# Patient Record
Sex: Female | Born: 1980 | Race: Black or African American | Hispanic: No | Marital: Single | Smoking: Never smoker
Health system: Southern US, Community
[De-identification: ages and names within clinical notes are randomized; demographics above are authoritative.]

## PROBLEM LIST (undated history)

## (undated) ENCOUNTER — Emergency Department (HOSPITAL_COMMUNITY): Admission: EM | Payer: BC Managed Care – PPO

## (undated) DIAGNOSIS — J45909 Unspecified asthma, uncomplicated: Secondary | ICD-10-CM

## (undated) HISTORY — PX: WISDOM TOOTH EXTRACTION: SHX21

---

## 2004-05-28 ENCOUNTER — Other Ambulatory Visit: Admission: RE | Admit: 2004-05-28 | Discharge: 2004-05-28 | Payer: Self-pay | Admitting: Gynecology

## 2004-11-28 ENCOUNTER — Other Ambulatory Visit: Admission: RE | Admit: 2004-11-28 | Discharge: 2004-11-28 | Payer: Self-pay | Admitting: Gynecology

## 2005-03-02 ENCOUNTER — Emergency Department (HOSPITAL_COMMUNITY): Admission: EM | Admit: 2005-03-02 | Discharge: 2005-03-02 | Payer: Self-pay | Admitting: Emergency Medicine

## 2005-06-17 ENCOUNTER — Other Ambulatory Visit: Admission: RE | Admit: 2005-06-17 | Discharge: 2005-06-17 | Payer: Self-pay | Admitting: Gynecology

## 2006-06-20 ENCOUNTER — Other Ambulatory Visit: Admission: RE | Admit: 2006-06-20 | Discharge: 2006-06-20 | Payer: Self-pay | Admitting: Gynecology

## 2006-08-03 ENCOUNTER — Emergency Department (HOSPITAL_COMMUNITY): Admission: EM | Admit: 2006-08-03 | Discharge: 2006-08-04 | Payer: Self-pay | Admitting: Emergency Medicine

## 2009-05-28 ENCOUNTER — Inpatient Hospital Stay (HOSPITAL_COMMUNITY): Admission: AD | Admit: 2009-05-28 | Discharge: 2009-05-29 | Payer: Self-pay | Admitting: Obstetrics and Gynecology

## 2009-06-01 ENCOUNTER — Inpatient Hospital Stay (HOSPITAL_COMMUNITY): Admission: AD | Admit: 2009-06-01 | Discharge: 2009-06-03 | Payer: Self-pay | Admitting: Obstetrics and Gynecology

## 2010-09-10 LAB — CBC
HCT: 35.9 % — ABNORMAL LOW (ref 36.0–46.0)
Hemoglobin: 12 g/dL (ref 12.0–15.0)
Hemoglobin: 9.2 g/dL — ABNORMAL LOW (ref 12.0–15.0)
MCV: 95.3 fL (ref 78.0–100.0)
MCV: 97.2 fL (ref 78.0–100.0)
Platelets: 221 10*3/uL (ref 150–400)
Platelets: 273 10*3/uL (ref 150–400)
RDW: 13.4 % (ref 11.5–15.5)
RDW: 13.5 % (ref 11.5–15.5)
WBC: 8.8 10*3/uL (ref 4.0–10.5)

## 2010-09-10 LAB — RPR: RPR Ser Ql: NONREACTIVE

## 2012-05-26 ENCOUNTER — Emergency Department (HOSPITAL_COMMUNITY)
Admission: EM | Admit: 2012-05-26 | Discharge: 2012-05-26 | Disposition: A | Discharge status: Home / Self Care | Payer: BC Managed Care – PPO | Attending: Emergency Medicine | Admitting: Emergency Medicine

## 2012-05-26 ENCOUNTER — Encounter (HOSPITAL_COMMUNITY): Payer: Self-pay | Admitting: Emergency Medicine

## 2012-05-26 DIAGNOSIS — Y92410 Unspecified street and highway as the place of occurrence of the external cause: Secondary | ICD-10-CM | POA: Insufficient documentation

## 2012-05-26 DIAGNOSIS — S46909A Unspecified injury of unspecified muscle, fascia and tendon at shoulder and upper arm level, unspecified arm, initial encounter: Secondary | ICD-10-CM | POA: Insufficient documentation

## 2012-05-26 DIAGNOSIS — S4980XA Other specified injuries of shoulder and upper arm, unspecified arm, initial encounter: Secondary | ICD-10-CM | POA: Insufficient documentation

## 2012-05-26 DIAGNOSIS — Y9389 Activity, other specified: Secondary | ICD-10-CM | POA: Insufficient documentation

## 2012-05-26 MED ORDER — CYCLOBENZAPRINE HCL 10 MG PO TABS: 10.0000 mg | ORAL_TABLET | Freq: Three times a day (TID) | ORAL | Status: DC | PRN

## 2012-05-26 MED ORDER — NAPROXEN 500 MG PO TABS: 500.0000 mg | ORAL_TABLET | Freq: Two times a day (BID) | ORAL | Status: DC
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
## 2012-05-26 NOTE — ED Provider Notes (Signed)
Medical screening examination/treatment/procedure(s) were performed by non-physician practitioner and as supervising physician I was immediately available for consultation/collaboration.   Virjean Boman M Shantika Bermea, DO 05/26/12 1908 
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
## 2014-01-12 ENCOUNTER — Encounter (HOSPITAL_COMMUNITY): Payer: Self-pay | Admitting: Emergency Medicine

## 2014-01-12 ENCOUNTER — Emergency Department (HOSPITAL_COMMUNITY)
Admission: EM | Admit: 2014-01-12 | Discharge: 2014-01-12 | Disposition: A | Discharge status: Home / Self Care | Payer: BC Managed Care – PPO | Attending: Emergency Medicine | Admitting: Emergency Medicine

## 2014-01-12 DIAGNOSIS — Z880 Allergy status to penicillin: Secondary | ICD-10-CM | POA: Insufficient documentation

## 2014-01-12 DIAGNOSIS — Z79899 Other long term (current) drug therapy: Secondary | ICD-10-CM | POA: Insufficient documentation

## 2014-01-12 DIAGNOSIS — M25473 Effusion, unspecified ankle: Secondary | ICD-10-CM | POA: Insufficient documentation

## 2014-01-12 DIAGNOSIS — M25472 Effusion, left ankle: Secondary | ICD-10-CM

## 2014-01-12 DIAGNOSIS — M25476 Effusion, unspecified foot: Principal | ICD-10-CM | POA: Insufficient documentation

## 2014-01-12 DIAGNOSIS — M79609 Pain in unspecified limb: Secondary | ICD-10-CM | POA: Insufficient documentation

## 2014-01-12 NOTE — ED Notes (Signed)
Pt c/o left foot swelling x 1 week. Denies injury to foot. Nad, skin warm and dry, resp e/u.

## 2014-01-12 NOTE — Discharge Instructions (Signed)
1. Medications: usual home medications 2. Treatment: rest, drink plenty of fluids, elevate foot 3. Follow Up: Please followup with your primary doctor in 3 days after the Venous Duplex for discussion of your diagnoses and further evaluation after today's visit; if you do not have a primary care doctor use the resource guide provided to find one;   IMPORTANT PATIENT INSTRUCTIONS:  You have been scheduled for an Outpatient Vascular Study at [facility].    If tomorrow is a Saturday or Sunday, please go to the [facility] Emergency Department Registration Desk at 8:30 am tomorrow morning and tell them you are there for a vascular study.  If tomorrow is a weekday (Monday-Friday), please go to [facility] Admitting Department at 8:30 am and tell them you  are  there for a vascular study.   Edema Edema is an abnormal buildup of fluids in your bodytissues. Edema is somewhatdependent on gravity to pull the fluid to the lowest place in your body. That makes the condition more common in the legs and thighs (lower extremities). Painless swelling of the feet and ankles is common and becomes more likely as you get older. It is also common in looser tissues, like around your eyes.  When the affected area is squeezed, the fluid may move out of that spot and leave a dent for a few moments. This dent is called pitting.  CAUSES  There are many possible causes of edema. Eating too much salt and being on your feet or sitting for a long time can cause edema in your legs and ankles. Hot weather may make edema worse. Common medical causes of edema include:  Heart failure.  Liver disease.  Kidney disease.  Weak blood vessels in your legs.  Cancer.  An injury.  Pregnancy.  Some medications.  Obesity. SYMPTOMS  Edema is usually painless.Your skin may look swollen or shiny.  DIAGNOSIS  Your health care provider may be able to diagnose edema by asking about your medical history and doing a physical  exam. You may need to have tests such as X-rays, an electrocardiogram, or blood tests to check for medical conditions that may cause edema.  TREATMENT  Edema treatment depends on the cause. If you have heart, liver, or kidney disease, you need the treatment appropriate for these conditions. General treatment may include:  Elevation of the affected body part above the level of your heart.  Compression of the affected body part. Pressure from elastic bandages or support stockings squeezes the tissues and forces fluid back into the blood vessels. This keeps fluid from entering the tissues.  Restriction of fluid and salt intake.  Use of a water pill (diuretic). These medications are appropriate only for some types of edema. They pull fluid out of your body and make you urinate more often. This gets rid of fluid and reduces swelling, but diuretics can have side effects. Only use diuretics as directed by your health care provider. HOME CARE INSTRUCTIONS   Keep the affected body part above the level of your heart when you are lying down.   Do not sit still or stand for prolonged periods.   Do not put anything directly under your knees when lying down.  Do not wear constricting clothing or garters on your upper legs.   Exercise your legs to work the fluid back into your blood vessels. This may help the swelling go down.   Wear elastic bandages or support stockings to reduce ankle swelling as directed by  your health care provider.   Eat a low-salt diet to reduce fluid if your health care provider recommends it.   Only take medicines as directed by your health care provider. SEEK MEDICAL CARE IF:   Your edema is not responding to treatment.  You have heart, liver, or kidney disease and notice symptoms of edema.  You have edema in your legs that does not improve after elevating them.   You have sudden and unexplained weight gain. SEEK IMMEDIATE MEDICAL CARE IF:   You develop  shortness of breath or chest pain.   You cannot breathe when you lie down.  You develop pain, redness, or warmth in the swollen areas.   You have heart, liver, or kidney disease and suddenly get edema.  You have a fever and your symptoms suddenly get worse. MAKE SURE YOU:   Understand these instructions.  Will watch your condition.  Will get help right away if you are not doing well or get worse. Document Released: 05/27/2005 Document Revised: 10/11/2013 Document Reviewed: 03/19/2013 [facility] Patient Information 2015 [place], [place]. This information is not intended to replace advice given to you by your health care provider. Make sure you discuss any questions you have with your health care provider.

## 2014-01-12 NOTE — ED Provider Notes (Signed)
CSN: [id number]     Arrival date & time 01/12/14  1823 History   This chart was scribed for non-physician practitioner, Dierdre ForthHannah Deonna Krummel PA-C, working with Samuel JesterKathleen McManus, DO by Milly JakobJohn Lee Graves, ED Scribe. The patient was seen in room TR08C/TR08C. Patient's care was started at 8:04 PM.   Chief Complaint  Patient presents with  . Foot Pain   Patient is a 33 y.o. female presenting with lower extremity pain. The history is provided by the patient and medical records. No language interpreter was used.  Foot Pain This is a new (painless swelling) problem. The current episode started more than 1 week ago. The problem occurs constantly. The problem has not changed since onset.Pertinent negatives include no chest pain, no abdominal pain, no headaches and no shortness of breath. Nothing aggravates the symptoms. Nothing relieves the symptoms.   HPI Comments: Ashley StanfordMichelle L Booth is a 33 y.o. female who presents to the Emergency Department complaining of painless swelling in her left ankle and foot onset 1.5 weeks ago. She denies running, exercise or injury. She denies fever or chills. She denies recent travel, surgery, long periods sitting still, recent fractures, exogenous estrogen. She denies history of DVT. Patient denies history of fevers or chills, chest pain, shortness of breath or palpitations.  She denies known trauma, recent fractures, ecchymosis or redness of the foot/ankle.    History reviewed. No pertinent past medical history. History reviewed. No pertinent past surgical history. No family history on file. History  Substance Use Topics  . Smoking status: Never Smoker   . Smokeless tobacco: Not on file  . Alcohol Use: Yes   OB History   Grav Para Term Preterm Abortions TAB SAB Ect Mult Living                 Review of Systems  Constitutional: Negative for fever, chills, diaphoresis, appetite change, fatigue and unexpected weight change.  HENT: Negative for mouth sores.   Eyes: Negative  for visual disturbance.  Respiratory: Negative for cough, chest tightness, shortness of breath and wheezing.   Cardiovascular: Negative for chest pain.  Gastrointestinal: Negative for nausea, vomiting, abdominal pain, diarrhea and constipation.  Endocrine: Negative for polydipsia, polyphagia and polyuria.  Genitourinary: Negative for dysuria, urgency, frequency and hematuria.  Musculoskeletal: Positive for joint swelling (left foot). Negative for back pain and neck stiffness.  Skin: Negative for rash.  Allergic/Immunologic: Negative for immunocompromised state.  Neurological: Negative for syncope, light-headedness and headaches.  Hematological: Does not bruise/bleed easily.  Psychiatric/Behavioral: Negative for sleep disturbance. The patient is not nervous/anxious.     Allergies  Penicillins and Sulfa antibiotics  Home Medications   Prior to Admission medications   Medication Sig Start Date End Date Taking? Authorizing Provider  Biotin 5000 MCG TABS Take 1 tablet by mouth daily.   Yes Historical Provider, MD   Triage Vitals: BP 159/66  Pulse 87  Temp(Src) 98.8 F (37.1 C)  Resp 18  SpO2 100%  LMP 12/30/2013 Physical Exam  Nursing note and vitals reviewed. Constitutional: She appears well-developed and well-nourished. No distress.  Awake, alert, nontoxic appearance  HENT:  Head: Normocephalic and atraumatic.  Mouth/Throat: Oropharynx is clear and moist. No oropharyngeal exudate.  Eyes: Conjunctivae are normal. No scleral icterus.  Neck: Normal range of motion. Neck supple.  Cardiovascular: Normal rate, regular rhythm, normal heart sounds and intact distal pulses.   No murmur heard. Capillary refill < 3 sec Regular rate and rhythm  Pulmonary/Chest: Effort normal and breath sounds normal. No  respiratory distress. She has no wheezes.  Equal chest expansion Clear and equal breath sounds without rales  Abdominal: Soft. Bowel sounds are normal. She exhibits no mass. There is no  tenderness. There is no rebound and no guarding.  Musculoskeletal: Normal range of motion. She exhibits edema. She exhibits no tenderness.  ROM: Full range of motion of all joints in the left lower extremity without pain Swelling of the mid forefoot to just above the ankle with pitting edema noted to the medial and lateral aspect of the ankle No pain to palpation of the left calf Negative Homans sign No palpable cord  Neurological: She is alert. Coordination normal.  Sensation intact to dull and sharp Strength 5/5 including dorsiflexion and plantar flexion and The patient ambulates without pain or difficulty  Skin: Skin is warm and dry. She is not diaphoretic. No erythema.  No tenting of the skin No erythema or induration over the ankle No increased warmth  Psychiatric: She has a normal mood and affect.    ED Course  Procedures (including critical care time) DIAGNOSTIC STUDIES: Oxygen Saturation is 100% on room air, normal by my interpretation.    COORDINATION OF CARE: 8:11 PM-Discussed treatment plan which includes follow up here for a left foot ultrasound with pt at bedside and pt agreed to plan.   Labs Review Labs Reviewed - No data to display  Imaging Review No results found.   EKG Interpretation None      MDM   Final diagnoses:  Left ankle swelling   Ashley Booth presents with several weeks of left ankle swelling. Patient is PERC negative and Wells DVT score of 1.  Patient is low risk but without known trauma, twisting of the ankle or other likely diagnosis but believes that we need to rule out DVT of the left leg. Swelling is without erythema or induration to suggest cellulitis. No pain with range of motion to the ankle to suggest a septic joint or gout. Patient is without systemic signs of infection.  Discussed with patient the need for outpatient venous duplex study.  Also discussed usual protocol of Lovenox administration tonight with study in the morning.   Patient reports she will report for the study in the morning but declines the Lovenox shot at this time.  She is low risk for DVT. She is not tachycardic and there is no evidence of PE.  Patient with unilateral leg swelling only. Patient without heart murmur, clear equal breath sounds and no rails to suggest congestive heart failure.  Patient denies chest pain or shortness of breath.  Doubt PE, CHF, gout, septic joint, cellulitis.    I have personally reviewed patient's vitals, nursing note and any pertinent labs or imaging.  I performed an undressed physical exam.    At this time, it has been determined that no acute conditions requiring further emergency intervention. The patient/guardian have been advised of the diagnosis and plan. I reviewed all labs and imaging including any potential incidental findings. We have discussed signs and symptoms that warrant return to the ED, such as short of breath, palpitations, increasing leg swelling, fevers.  Patient/guardian has voiced understanding and agreed to follow-up with the PCP or specialist in 3 days after venous duplex in the morning.  Vital signs are stable at discharge.   BP 134/79  Pulse 80  Temp(Src) 98.9 F (37.2 C) (Oral)  Resp 16  SpO2 100%  LMP 12/30/2013        Dierdre Forth, PA-C 01/12/14  2046 

## 2014-01-13 ENCOUNTER — Ambulatory Visit (HOSPITAL_COMMUNITY)
Admission: RE | Admit: 2014-01-13 | Discharge: 2014-01-13 | Disposition: A | Discharge status: Home / Self Care | Payer: BC Managed Care – PPO | Source: Ambulatory Visit | Attending: Emergency Medicine | Admitting: Emergency Medicine

## 2014-01-13 DIAGNOSIS — M7989 Other specified soft tissue disorders: Secondary | ICD-10-CM | POA: Insufficient documentation

## 2014-01-13 NOTE — ED Provider Notes (Signed)
Medical screening examination/treatment/procedure(s) were performed by non-physician practitioner and as supervising physician I was immediately available for consultation/collaboration.   EKG Interpretation None        Samuel JesterKathleen Amry Cathy, DO 01/13/14 2354

## 2014-01-13 NOTE — Progress Notes (Signed)
VASCULAR LAB PRELIMINARY  PRELIMINARY  PRELIMINARY  PRELIMINARY  Left lower extremity venous duplex completed.    Preliminary report:  Left:  No evidence of DVT, superficial thrombosis, or Baker's cyst.  Sherrell Farish, RVT 01/13/2014, 9:46 AM

## 2014-05-04 LAB — OB RESULTS CONSOLE HIV ANTIBODY (ROUTINE TESTING): HIV: NONREACTIVE

## 2014-05-04 LAB — OB RESULTS CONSOLE RPR: RPR: NONREACTIVE

## 2014-05-04 LAB — OB RESULTS CONSOLE HEPATITIS B SURFACE ANTIGEN: HEP B S AG: NEGATIVE

## 2014-05-04 LAB — OB RESULTS CONSOLE RUBELLA ANTIBODY, IGM: RUBELLA: IMMUNE

## 2014-05-04 LAB — OB RESULTS CONSOLE GC/CHLAMYDIA
Chlamydia: NEGATIVE
GC PROBE AMP, GENITAL: NEGATIVE

## 2014-05-23 ENCOUNTER — Inpatient Hospital Stay (HOSPITAL_COMMUNITY)
Admission: AD | Admit: 2014-05-23 | Payer: No Typology Code available for payment source | Source: Ambulatory Visit | Admitting: Obstetrics and Gynecology

## 2014-11-01 LAB — OB RESULTS CONSOLE GBS: GBS: NEGATIVE

## 2014-11-20 ENCOUNTER — Inpatient Hospital Stay (HOSPITAL_COMMUNITY)
Admission: AD | Admit: 2014-11-20 | Discharge: 2014-11-20 | Disposition: A | Discharge status: Home / Self Care | Payer: BC Managed Care – PPO | Source: Ambulatory Visit | Attending: Obstetrics and Gynecology | Admitting: Obstetrics and Gynecology

## 2014-11-20 ENCOUNTER — Encounter (HOSPITAL_COMMUNITY): Payer: Self-pay | Admitting: *Deleted

## 2014-11-20 DIAGNOSIS — O9989 Other specified diseases and conditions complicating pregnancy, childbirth and the puerperium: Secondary | ICD-10-CM | POA: Insufficient documentation

## 2014-11-20 DIAGNOSIS — Z3A39 39 weeks gestation of pregnancy: Secondary | ICD-10-CM | POA: Diagnosis not present

## 2014-11-20 HISTORY — DX: Unspecified asthma, uncomplicated: J45.909

## 2014-11-20 LAB — AMNISURE RUPTURE OF MEMBRANE (ROM) NOT AT ARMC: AMNISURE: NEGATIVE

## 2014-11-20 LAB — POCT FERN TEST

## 2014-11-20 NOTE — Discharge Instructions (Signed)
Third Trimester of Pregnancy The third trimester is from week 29 through week 42, months 7 through 9. The third trimester is a time when the fetus is growing rapidly. At the end of the ninth month, the fetus is about 20 inches in length and weighs 6-10 pounds.  BODY CHANGES Your body goes through many changes during pregnancy. The changes vary from woman to woman.   Your weight will continue to increase. You can expect to gain 25-35 pounds (11-16 kg) by the end of the pregnancy.  You may begin to get stretch marks on your hips, abdomen, and breasts.  You may urinate more often because the fetus is moving lower into your pelvis and pressing on your bladder.  You may develop or continue to have heartburn as a result of your pregnancy.  You may develop constipation because certain hormones are causing the muscles that push waste through your intestines to slow down.  You may develop hemorrhoids or swollen, bulging veins (varicose veins).  You may have pelvic pain because of the weight gain and pregnancy hormones relaxing your joints between the bones in your pelvis. Backaches may result from overexertion of the muscles supporting your posture.  You may have changes in your hair. These can include thickening of your hair, rapid growth, and changes in texture. Some women also have hair loss during or after pregnancy, or hair that feels dry or thin. Your hair will most likely return to normal after your baby is born.  Your breasts will continue to grow and be tender. A yellow discharge may leak from your breasts called colostrum.  Your belly button may stick out.  You may feel short of breath because of your expanding uterus.  You may notice the fetus "dropping," or moving lower in your abdomen.  You may have a bloody mucus discharge. This usually occurs a few days to a week before labor begins.  Your cervix becomes thin and soft (effaced) near your due date. WHAT TO EXPECT AT YOUR PRENATAL  EXAMS  You will have prenatal exams every 2 weeks until week 36. Then, you will have weekly prenatal exams. During a routine prenatal visit:  You will be weighed to make sure you and the fetus are growing normally.  Your blood pressure is taken.  Your abdomen will be measured to track your baby's growth.  The fetal heartbeat will be listened to.  Any test results from the previous visit will be discussed.  You may have a cervical check near your due date to see if you have effaced. At around 36 weeks, your caregiver will check your cervix. At the same time, your caregiver will also perform a test on the secretions of the vaginal tissue. This test is to determine if a type of bacteria, Group B streptococcus, is present. Your caregiver will explain this further. Your caregiver may ask you:  What your birth plan is.  How you are feeling.  If you are feeling the baby move.  If you have had any abnormal symptoms, such as leaking fluid, bleeding, severe headaches, or abdominal cramping.  If you have any questions. Other tests or screenings that may be performed during your third trimester include:  Blood tests that check for low iron levels (anemia).  Fetal testing to check the health, activity level, and growth of the fetus. Testing is done if you have certain medical conditions or if there are problems during the pregnancy. FALSE LABOR You may feel small, irregular contractions that   eventually go away. These are called Braxton Hicks contractions, or false labor. Contractions may last for hours, days, or even weeks before true labor sets in. If contractions come at regular intervals, intensify, or become painful, it is best to be seen by your caregiver.  SIGNS OF LABOR   Menstrual-like cramps.  Contractions that are 5 minutes apart or less.  Contractions that start on the top of the uterus and spread down to the lower abdomen and back.  A sense of increased pelvic pressure or back  pain.  A watery or bloody mucus discharge that comes from the vagina. If you have any of these signs before the 37th week of pregnancy, call your caregiver right away. You need to go to the hospital to get checked immediately. HOME CARE INSTRUCTIONS   Avoid all smoking, herbs, alcohol, and unprescribed drugs. These chemicals affect the formation and growth of the baby.  Follow your caregiver's instructions regarding medicine use. There are medicines that are either safe or unsafe to take during pregnancy.  Exercise only as directed by your caregiver. Experiencing uterine cramps is a good sign to stop exercising.  Continue to eat regular, healthy meals.  Wear a good support bra for breast tenderness.  Do not use hot tubs, steam rooms, or saunas.  Wear your seat belt at all times when driving.  Avoid raw meat, uncooked cheese, cat litter boxes, and soil used by cats. These carry germs that can cause birth defects in the baby.  Take your prenatal vitamins.  Try taking a stool softener (if your caregiver approves) if you develop constipation. Eat more high-fiber foods, such as fresh vegetables or fruit and whole grains. Drink plenty of fluids to keep your urine clear or pale yellow.  Take warm sitz baths to soothe any pain or discomfort caused by hemorrhoids. Use hemorrhoid cream if your caregiver approves.  If you develop varicose veins, wear support hose. Elevate your feet for 15 minutes, 3-4 times a day. Limit salt in your diet.  Avoid heavy lifting, wear low heal shoes, and practice good posture.  Rest a lot with your legs elevated if you have leg cramps or low back pain.  Visit your dentist if you have not gone during your pregnancy. Use a soft toothbrush to brush your teeth and be gentle when you floss.  A sexual relationship may be continued unless your caregiver directs you otherwise.  Do not travel far distances unless it is absolutely necessary and only with the approval  of your caregiver.  Take prenatal classes to understand, practice, and ask questions about the labor and delivery.  Make a trial run to the hospital.  Pack your hospital bag.  Prepare the baby's nursery.  Continue to go to all your prenatal visits as directed by your caregiver. SEEK MEDICAL CARE IF:  You are unsure if you are in labor or if your water has broken.  You have dizziness.  You have mild pelvic cramps, pelvic pressure, or nagging pain in your abdominal area.  You have persistent nausea, vomiting, or diarrhea.  You have a bad smelling vaginal discharge.  You have pain with urination. SEEK IMMEDIATE MEDICAL CARE IF:   You have a fever.  You are leaking fluid from your vagina.  You have spotting or bleeding from your vagina.  You have severe abdominal cramping or pain.  You have rapid weight loss or gain.  You have shortness of breath with chest pain.  You notice sudden or extreme swelling   of your face, hands, ankles, feet, or legs.  You have not felt your baby move in over an hour.  You have severe headaches that do not go away with medicine.  You have vision changes. Document Released: 05/21/2001 Document Revised: 06/01/2013 Document Reviewed: 07/28/2012 Exit[employer] Patient Information 2015 [employer]. This information is not intended to replace advice given to you by your health care provider. Make sure you discuss any questions you have with your health care provider.  

## 2014-11-20 NOTE — MAU Note (Signed)
Pt states ?ROM two days ago. Has had intermittent trickle of fluid. Was 2.5cm at last exam.

## 2014-11-20 NOTE — Progress Notes (Signed)
Order obtained for [facility]

## 2014-11-20 NOTE — Progress Notes (Signed)
Amnisure negative, orders to d/c home.

## 2014-11-22 ENCOUNTER — Inpatient Hospital Stay (HOSPITAL_COMMUNITY): Payer: BC Managed Care – PPO | Admitting: Anesthesiology

## 2014-11-22 ENCOUNTER — Inpatient Hospital Stay (HOSPITAL_COMMUNITY)
Admission: AD | Admit: 2014-11-22 | Discharge: 2014-11-24 | DRG: 775 | Disposition: A | Discharge status: Home / Self Care | Payer: BC Managed Care – PPO | Source: Ambulatory Visit | Attending: Obstetrics and Gynecology | Admitting: Obstetrics and Gynecology

## 2014-11-22 ENCOUNTER — Encounter (HOSPITAL_COMMUNITY): Payer: Self-pay | Admitting: *Deleted

## 2014-11-22 DIAGNOSIS — Z3A39 39 weeks gestation of pregnancy: Secondary | ICD-10-CM | POA: Diagnosis present

## 2014-11-22 DIAGNOSIS — Z3490 Encounter for supervision of normal pregnancy, unspecified, unspecified trimester: Secondary | ICD-10-CM

## 2014-11-22 LAB — TYPE AND SCREEN
ABO/RH(D): B POS
ANTIBODY SCREEN: NEGATIVE

## 2014-11-22 LAB — CBC
HCT: 30.9 % — ABNORMAL LOW (ref 36.0–46.0)
Hemoglobin: 10.6 g/dL — ABNORMAL LOW (ref 12.0–15.0)
MCH: 29.9 pg (ref 26.0–34.0)
MCHC: 34.3 g/dL (ref 30.0–36.0)
MCV: 87 fL (ref 78.0–100.0)
PLATELETS: 268 10*3/uL (ref 150–400)
RBC: 3.55 MIL/uL — ABNORMAL LOW (ref 3.87–5.11)
RDW: 14.1 % (ref 11.5–15.5)
WBC: 6.5 10*3/uL (ref 4.0–10.5)

## 2014-11-22 LAB — COMPREHENSIVE METABOLIC PANEL
ALT: 21 U/L (ref 14–54)
ANION GAP: 10 (ref 5–15)
AST: 29 U/L (ref 15–41)
Albumin: 3.2 g/dL — ABNORMAL LOW (ref 3.5–5.0)
Alkaline Phosphatase: 124 U/L (ref 38–126)
BUN: 7 mg/dL (ref 6–20)
CALCIUM: 8.8 mg/dL — AB (ref 8.9–10.3)
CO2: 20 mmol/L — ABNORMAL LOW (ref 22–32)
CREATININE: 0.42 mg/dL — AB (ref 0.44–1.00)
Chloride: 105 mmol/L (ref 101–111)
GFR calc Af Amer: >60 mL/min (ref >60)
GLUCOSE: 91 mg/dL (ref 65–99)
Potassium: 3.7 mmol/L (ref 3.5–5.1)
Sodium: 135 mmol/L (ref 135–145)
Total Bilirubin: 0.4 mg/dL (ref 0.3–1.2)
Total Protein: 6.4 g/dL — ABNORMAL LOW (ref 6.5–8.1)

## 2014-11-22 LAB — ABO/RH: ABO/RH(D): B POS

## 2014-11-22 MED ORDER — IBUPROFEN 600 MG PO TABS
600.0000 mg | ORAL_TABLET | Freq: Four times a day (QID) | ORAL | Status: DC
  Administered 2014-11-22 – 2014-11-24 (×6): 600 mg via ORAL
  Filled 2014-11-22 (×6): qty 1

## 2014-11-22 MED ORDER — OXYCODONE-ACETAMINOPHEN 5-325 MG PO TABS: 1.0000 | ORAL_TABLET | ORAL | Status: DC | PRN

## 2014-11-22 MED ORDER — FLEET ENEMA 7-19 GM/118ML RE ENEM: 1.0000 | ENEMA | RECTAL | Status: DC | PRN

## 2014-11-22 MED ORDER — CITRIC ACID-SODIUM CITRATE 334-500 MG/5ML PO SOLN: 30.0000 mL | ORAL | Status: DC | PRN

## 2014-11-22 MED ORDER — BENZOCAINE-MENTHOL 20-0.5 % EX AERO
1.0000 "application " | INHALATION_SPRAY | CUTANEOUS | Status: DC | PRN
  Filled 2014-11-22: qty 56

## 2014-11-22 MED ORDER — PRENATAL MULTIVITAMIN CH
1.0000 | ORAL_TABLET | Freq: Every day | ORAL | Status: DC
Start: 2014-11-23 — End: 2014-11-24

## 2014-11-22 MED ORDER — PHENYLEPHRINE 40 MCG/ML (10ML) SYRINGE FOR IV PUSH (FOR BLOOD PRESSURE SUPPORT)
80.0000 ug | PREFILLED_SYRINGE | INTRAVENOUS | Status: DC | PRN
  Filled 2014-11-22: qty 20
  Filled 2014-11-22: qty 2

## 2014-11-22 MED ORDER — LANOLIN HYDROUS EX OINT: TOPICAL_OINTMENT | CUTANEOUS | Status: DC | PRN

## 2014-11-22 MED ORDER — WITCH HAZEL-GLYCERIN EX PADS: 1.0000 "application " | MEDICATED_PAD | CUTANEOUS | Status: DC | PRN

## 2014-11-22 MED ORDER — DIPHENHYDRAMINE HCL 25 MG PO CAPS: 25.0000 mg | ORAL_CAPSULE | Freq: Four times a day (QID) | ORAL | Status: DC | PRN

## 2014-11-22 MED ORDER — OXYCODONE-ACETAMINOPHEN 5-325 MG PO TABS: 2.0000 | ORAL_TABLET | ORAL | Status: DC | PRN

## 2014-11-22 MED ORDER — ONDANSETRON HCL 4 MG/2ML IJ SOLN: 4.0000 mg | INTRAMUSCULAR | Status: DC | PRN

## 2014-11-22 MED ORDER — TETANUS-DIPHTH-ACELL PERTUSSIS 5-2.5-18.5 LF-MCG/0.5 IM SUSP: 0.5000 mL | Freq: Once | INTRAMUSCULAR | Status: DC

## 2014-11-22 MED ORDER — OXYTOCIN 40 UNITS IN LACTATED RINGERS INFUSION - SIMPLE MED
62.5000 mL/h | INTRAVENOUS | Status: DC
Start: 2014-11-22 — End: 2014-11-22
  Filled 2014-11-22: qty 1000

## 2014-11-22 MED ORDER — ONDANSETRON HCL 4 MG PO TABS
4.0000 mg | ORAL_TABLET | ORAL | Status: DC | PRN
Start: 2014-11-22 — End: 2014-11-24

## 2014-11-22 MED ORDER — MEASLES, MUMPS & RUBELLA VAC ~~LOC~~ INJ: 0.5000 mL | INJECTION | Freq: Once | SUBCUTANEOUS | Status: DC

## 2014-11-22 MED ORDER — EPHEDRINE 5 MG/ML INJ
10.0000 mg | INTRAVENOUS | Status: DC | PRN
  Filled 2014-11-22: qty 2

## 2014-11-22 MED ORDER — LACTATED RINGERS IV SOLN: 500.0000 mL | INTRAVENOUS | Status: DC | PRN

## 2014-11-22 MED ORDER — ZOLPIDEM TARTRATE 5 MG PO TABS: 5.0000 mg | ORAL_TABLET | Freq: Every evening | ORAL | Status: DC | PRN

## 2014-11-22 MED ORDER — SIMETHICONE 80 MG PO CHEW
80.0000 mg | CHEWABLE_TABLET | ORAL | Status: DC | PRN
Start: 2014-11-22 — End: 2014-11-24

## 2014-11-22 MED ORDER — LIDOCAINE HCL (PF) 1 % IJ SOLN
30.0000 mL | INTRAMUSCULAR | Status: DC | PRN
  Filled 2014-11-22: qty 30

## 2014-11-22 MED ORDER — PRENATAL MULTIVITAMIN CH: 1.0000 | ORAL_TABLET | Freq: Every day | ORAL | Status: DC

## 2014-11-22 MED ORDER — OXYTOCIN 40 UNITS IN LACTATED RINGERS INFUSION - SIMPLE MED: 62.5000 mL/h | INTRAVENOUS | Status: AC

## 2014-11-22 MED ORDER — DIPHENHYDRAMINE HCL 50 MG/ML IJ SOLN: 12.5000 mg | INTRAMUSCULAR | Status: DC | PRN

## 2014-11-22 MED ORDER — LACTATED RINGERS IV SOLN
INTRAVENOUS | Status: DC
  Administered 2014-11-22: 17:00:00 via INTRAVENOUS

## 2014-11-22 MED ORDER — LACTATED RINGERS IV SOLN: INTRAVENOUS | Status: AC

## 2014-11-22 MED ORDER — ACETAMINOPHEN 325 MG PO TABS: 650.0000 mg | ORAL_TABLET | ORAL | Status: DC | PRN

## 2014-11-22 MED ORDER — DIBUCAINE 1 % RE OINT: 1.0000 "application " | TOPICAL_OINTMENT | RECTAL | Status: DC | PRN

## 2014-11-22 MED ORDER — FENTANYL 2.5 MCG/ML BUPIVACAINE 1/10 % EPIDURAL INFUSION (WH - ANES)
14.0000 mL/h | INTRAMUSCULAR | Status: DC | PRN
Start: 2014-11-22 — End: 2014-11-22
  Administered 2014-11-22: 14 mL/h via EPIDURAL
  Filled 2014-11-22: qty 125

## 2014-11-22 MED ORDER — SENNOSIDES-DOCUSATE SODIUM 8.6-50 MG PO TABS
2.0000 | ORAL_TABLET | ORAL | Status: DC
  Administered 2014-11-23: 2 via ORAL
  Filled 2014-11-22 (×2): qty 2

## 2014-11-22 MED ORDER — OXYTOCIN BOLUS FROM INFUSION
500.0000 mL | INTRAVENOUS | Status: DC
  Administered 2014-11-22: 500 mL via INTRAVENOUS

## 2014-11-22 MED ORDER — ONDANSETRON HCL 4 MG/2ML IJ SOLN: 4.0000 mg | Freq: Four times a day (QID) | INTRAMUSCULAR | Status: DC | PRN

## 2014-11-22 MED ORDER — LIDOCAINE HCL (PF) 1 % IJ SOLN
INTRAMUSCULAR | Status: DC | PRN
  Administered 2014-11-22 (×2): 4 mL

## 2014-11-22 MED ORDER — FERROUS SULFATE 325 (65 FE) MG PO TABS
325.0000 mg | ORAL_TABLET | Freq: Two times a day (BID) | ORAL | Status: DC
  Administered 2014-11-23 – 2014-11-24 (×3): 325 mg via ORAL
  Filled 2014-11-22 (×3): qty 1

## 2014-11-22 NOTE — H&P (Signed)
NAME:  Ashley, Booth               ACCOUNT NO.:  [id number]  MEDICAL RECORD NO.:  [id number]  LOCATION:  9173                          FACILITY:  [facility]  PHYSICIAN:  Malachi Pro. Ambrose Mantle, M.D. DATE OF BIRTH:  1980/09/26  DATE OF ADMISSION:  11/22/2014 DATE OF DISCHARGE:                             HISTORY & PHYSICAL   HISTORY OF PRESENT ILLNESS:  This is a 34 year old African American female, para 1-0-0-1, gravida 2, EDC November 23, 2014, admitted with early labor.  Ultrasound on May 04, 2014, was 11 weeks 4 days, gave her a due date of November 19, 2014, [facility] by her last period was November 23, 2014, and November 23, 2014 was chosen as the due date.  Blood group and type B positive, negative antibody.  RPR negative.  Urine culture negative. Hepatitis B surface antigen negative.  HIV negative.  GC and Chlamydia negative.  Rubella immune.  Repeat HIV and RPR were negative.  Group B strep was negative.  The patient declines screening tests.  The patient was seen on November 16, 2014, the cervix was 2 cm, 90%.  Today, when she was examined, she had been contracting for an hour, she was 3 cm, 100%, vertex, was in a -1 station.  Dr. Jackelyn Knife sent her to the MAU, and she was admitted.  PAST MEDICAL HISTORY:  Reveals no significant medical illness except asthma.  SURGERIES:  Cryotherapy of the cervix and wisdom teeth extraction.  ALLERGIES:  Penicillin, hives as an infant and hives as a child to sulfa.  FAMILY HISTORY:  Mother with diabetes and high blood pressure.  Brother with high blood pressure and sister with high blood pressure.  OBSTETRIC HISTORY:  One baby.  Never smoked.  Never drank.  Does not use drugs.  She is a Veterinary surgeon at [employer].  PHYSICAL EXAMINATION:  GENERAL:  The patient was admitted with assumed labor. VITAL SIGNS:  Her blood pressure is 125/67, pulse is 81, respirations 18, temperature 97, heart normal size and sounds.  No murmurs. LUNGS:  Clear to auscultation. GU:   Fundal height at her visit last week was 39.5 cm.  Fetal heart tones are normal.  Cervix was thought to be 3 cm, 100%, vertex, at -1 in the office.  ADMITTING IMPRESSION:  Intrauterine pregnancy at term, early labor.  The patient is admitted and management of labor will be dictated by her progress.     Malachi Pro. Ambrose Mantle, M.D.     TFH/MEDQ  D:  11/22/2014  T:  11/22/2014  Job:  [id number]

## 2014-11-22 NOTE — Anesthesia Procedure Notes (Signed)
Epidural Patient location during procedure: OB  Staffing Anesthesiologist: Jenette Rayson Performed by: anesthesiologist   Preanesthetic Checklist Completed: patient identified, site marked, surgical consent, pre-op evaluation, timeout performed, IV checked, risks and benefits discussed and monitors and equipment checked  Epidural Patient position: sitting Prep: site prepped and draped and DuraPrep Patient monitoring: continuous pulse ox and blood pressure Approach: midline Location: L3-L4 Injection technique: LOR saline  Needle:  Needle type: Tuohy  Needle gauge: 17 G Needle length: 9 cm and 9 Needle insertion depth: 6 cm Catheter type: closed end flexible Catheter size: 19 Gauge Catheter at skin depth: 10 cm Test dose: negative  Assessment Events: blood not aspirated, injection not painful, no injection resistance, negative IV test and no paresthesia  Additional Notes Patient identified. Risks/Benefits/Options discussed with patient including but not limited to bleeding, infection, nerve damage, paralysis, failed block, incomplete pain control, headache, blood pressure changes, nausea, vomiting, reactions to medication both or allergic, itching and postpartum back pain. Confirmed with bedside nurse the patient's most recent platelet count. Confirmed with patient that they are not currently taking any anticoagulation, have any bleeding history or any family history of bleeding disorders. Patient expressed understanding and wished to proceed. All questions were answered. Sterile technique was used throughout the entire procedure. Please see nursing notes for vital signs. Test dose was given through epidural catheter and negative prior to continuing to dose epidural or start infusion. Warning signs of high block given to the patient including shortness of breath, tingling/numbness in hands, complete motor block, or any concerning symptoms with instructions to call for help. Patient was  given instructions on fall risk and not to get out of bed. All questions and concerns addressed with instructions to call with any issues or inadequate analgesia.    

## 2014-11-22 NOTE — Anesthesia Preprocedure Evaluation (Signed)
Anesthesia Evaluation  Patient identified by MRN, date of birth, ID band Patient awake    Reviewed: Allergy & Precautions, NPO status , Patient's Chart, lab work & pertinent test results  History of Anesthesia Complications Negative for: history of anesthetic complications  Airway Mallampati: II  TM Distance: >3 FB Neck ROM: Full    Dental no notable dental hx. (+) Dental Advisory Given   Pulmonary asthma ,  breath sounds clear to auscultation  Pulmonary exam normal       Cardiovascular negative cardio ROS Normal cardiovascular examRhythm:Regular Rate:Normal     Neuro/Psych negative neurological ROS  negative psych ROS   GI/Hepatic negative GI ROS, Neg liver ROS,   Endo/Other  negative endocrine ROS  Renal/GU negative Renal ROS  negative genitourinary   Musculoskeletal negative musculoskeletal ROS (+)   Abdominal   Peds negative pediatric ROS (+)  Hematology negative hematology ROS (+)   Anesthesia Other Findings   Reproductive/Obstetrics (+) Pregnancy                             Anesthesia Physical Anesthesia Plan  ASA: II  Anesthesia Plan: Epidural   Post-op Pain Management:    Induction:   Airway Management Planned:   Additional Equipment:   Intra-op Plan:   Post-operative Plan:   Informed Consent: I have reviewed the patients History and Physical, chart, labs and discussed the procedure including the risks, benefits and alternatives for the proposed anesthesia with the patient or authorized representative who has indicated his/her understanding and acceptance.   Dental advisory given  Plan Discussed with: CRNA  Anesthesia Plan Comments:         Anesthesia Quick Evaluation

## 2014-11-22 NOTE — Progress Notes (Addendum)
Patient ID: Ashley Booth, female   DOB: Sep 21, 1980, 34 y.o.   MRN: [id number] Delivery note:  The patient reached full dilatation and after her husband arrived she pushed well but the vertex was LOP She made slow descent and with the FHR remaining below 100 a bell cup Mity Vac vacuum was applied and with 1 contraction  and no pop offs the vertex descended and in the process rotated to LOA and delivered. The infant was female and Apgars were 9 and 9 at 1 and 5 minutes. The placenta delivered intact and the uterus was normal. The second degree ML laceration was repaired with 3-0 vicryl. No local was required. EBL < 100 cc's.

## 2014-11-22 NOTE — Progress Notes (Signed)
Patient ID: Ashley Booth, female   DOB: Sep 03, 1980, 34 y.o.   MRN: [id number] AROM was done at 5:57 PM with clear fluid. Contractions were q 2 and 1/2 to 4 and 1/2 minutes. The cervix was 4 cm 100 % effaced and the vertex was at -1/0 station. At 6:45 PM the cervix was 9 cm 100% effaced and the vertex was at 0 station.

## 2014-11-23 LAB — CBC
HEMATOCRIT: 26.7 % — AB (ref 36.0–46.0)
HEMOGLOBIN: 9.1 g/dL — AB (ref 12.0–15.0)
MCH: 29.6 pg (ref 26.0–34.0)
MCHC: 34.1 g/dL (ref 30.0–36.0)
MCV: 87 fL (ref 78.0–100.0)
Platelets: 223 10*3/uL (ref 150–400)
RBC: 3.07 MIL/uL — AB (ref 3.87–5.11)
RDW: 14.1 % (ref 11.5–15.5)
WBC: 12.1 10*3/uL — AB (ref 4.0–10.5)

## 2014-11-23 LAB — RPR: RPR Ser Ql: NONREACTIVE

## 2014-11-23 NOTE — Progress Notes (Signed)
Patient ID: Ashley Booth, female   DOB: [birth year], 34 y.o.   MRN: [id number] #1 afebrile BP normal no complaints HGb 10.6 to 9.1

## 2014-11-23 NOTE — Anesthesia Postprocedure Evaluation (Signed)
Anesthesia Post Note  Patient: Ashley Booth  Procedure(s) Performed: * No procedures listed *  Anesthesia type: Epidural  Patient location: Mother/Baby  Post pain: Pain level controlled  Post assessment: Post-op Vital signs reviewed  Last Vitals:  Filed Vitals:   11/23/14 0343  BP: 115/64  Pulse: 89  Temp: 37.1 C  Resp:     Post vital signs: Reviewed  Level of consciousness:alert  Complications: No apparent anesthesia complications

## 2014-11-24 MED ORDER — IBUPROFEN 600 MG PO TABS: 600.0000 mg | ORAL_TABLET | Freq: Four times a day (QID) | ORAL | Status: DC | PRN

## 2014-11-24 NOTE — Progress Notes (Signed)
Patient ID: Ashley Booth, female   DOB: December 06, 1980, 34 y.o.   MRN: [id number] #2 afebrile BP normal for d/c

## 2014-11-24 NOTE — Discharge Instructions (Signed)
booklet °

## 2014-11-24 NOTE — Discharge Summary (Signed)
NAME:  Ashley Booth, Ashley Booth               ACCOUNT NO.:  [id number]  MEDICAL RECORD NO.:  [id number]  LOCATION:  9134                          FACILITY:  [facility]  PHYSICIAN:  Malachi Pro. Ambrose Mantle, M.D. DATE OF BIRTH:  [birth year]  DATE OF ADMISSION:  11/22/2014 DATE OF DISCHARGE:  11/24/2014                              DISCHARGE SUMMARY   A 34 year old black female, para 1-0-0-1, gravida 2, EDC November 23, 2014, admitted with early labor at 6:48 a.m.  Artificial rupture of membranes had been done at 5:57 p.m. with clear fluid at 6:48 p.m., contractions every 2-4 minutes.  Cervix was 4 cm, 100% vertex, at a -1 to 0 station. At 6:45 p.m., the cervix was 9 cm, 100% vertex at a 0 station.  She reached full dilatation and after her husband arrived, she pushed well, but the vertex was LOP.  She made slow descent when the fetal heart rate remaining below 100, a Bell cup Mityvac vacuum was applied and with 1 contraction and no pop-offs, the vertex descended and the process rotated LOA and delivered.  The infant was female 6 pounds 13 ounces. Apgars of 9 and 9 at 1 and 5 minutes.  Placenta delivered intact. Uterus was normal.  Second-degree midline laceration repaired with 3-0 Vicryl.  No local was required.  Blood loss was less than 100 mL. Postpartum, the patient did quite well and on the second postpartum day, was ready for discharge.  Initial hemoglobin 10.6, hematocrit 30.9, white count 6500, platelet count 268,000.  Followup hemoglobin 9.1.  FINAL DIAGNOSES:  Intrauterine pregnancy at 39 plus weeks, delivered left occiput anterior, fetal bradycardia.  OPERATION:  Vacuum-assisted vaginal delivery, left occiput anterior, repair of second-degree midline laceration.  FINAL CONDITION:  Improved.  INSTRUCTIONS:  Include our regular discharge instruction booklet after visit summary.  Prescription for Motrin 600 mg 30 tablets, 1 every 6 hours as needed for pain.  The patient is advised to continue  her prenatal vitamins and take ferrous sulfate 325 mg twice a day.  Return to the office in 6 weeks for followup examination.     Malachi Pro. Ambrose Mantle, M.D.     TFH/MEDQ  D:  11/24/2014  T:  11/24/2014  Job:  [id number]

## 2014-11-24 NOTE — Lactation Note (Addendum)
This note was copied from the chart of Ashley Booth. Lactation Consultation Note  Patient Name: Ashley Fran Heckard HGDJM'E Date: 11/24/2014 Reason for consult: Follow-up assessment  Baby is 18 hours old and has been cluster feeding all night per mom and according to the doc flow sheets .  LC updated the doc flow sheets. Voids and stools adequate for age, 6 % weight loss, 6-7.5 oz. @ 33.5 hrs of life Bili 5.3. Baby fussy and hungry while LC in the room. LC assisted mom with depth at the breast after breast massage , hand express,  Baby latched with depth and swallows. Stayed latched for 5 mins and released and re-latched and feeding in a consistent pattern. LC recommended due to baby cluster feeding , to be consistent skin to skin feedings until baby can stay awake for a feeding  And breast massage, hand express, pre- pump with hand pump to prime milk ducts for more volume every feeding. Sore nipple and engorgement prevention and tx reviewed , referring to pages 24. Mom denies soreness.  Mom already has a hand pump ( given by [facility] RN ) , also has a DEBP at home.  Mother informed of post-discharge support and given phone number to the lactation department, including services for phone  call assistance; out-patient appointments; and breastfeeding support group. List of other breastfeeding resources in the community  given in the handout. Encouraged mother to call for problems or concerns related to breastfeeding.    Maternal Data Has patient been taught Hand Expression?: Yes  Feeding Feeding Type: Breast Fed Length of feed: 20 min (per mom )  LATCH Score/Interventions Latch: Grasps breast easily, tongue down, lips flanged, rhythmical sucking. Intervention(s): Adjust position;Assist with latch;Breast massage;Breast compression  Audible Swallowing: A few with stimulation  Type of Nipple: Everted at rest and after stimulation  Comfort (Breast/Nipple): Soft / non-tender     Hold  (Positioning): Assistance needed to correctly position infant at breast and maintain latch. (worked on depth ) Intervention(s): Breastfeeding basics reviewed;Support Pillows;Position options;Skin to skin  LATCH Score: 8  Lactation Tools Discussed/Used Tools: Pump (given by the RN ) Breast pump type: Manual   Consult Status Consult Status: Complete Date: 11/24/14 Follow-up type: In-patient    Kathrin Greathouse 11/24/2014, 11:38 AM

## 2015-05-23 ENCOUNTER — Encounter (HOSPITAL_COMMUNITY): Payer: Self-pay | Admitting: Neurology

## 2015-05-23 ENCOUNTER — Emergency Department (HOSPITAL_COMMUNITY)
Admission: EM | Admit: 2015-05-23 | Discharge: 2015-05-23 | Disposition: A | Discharge status: Home / Self Care | Payer: BC Managed Care – PPO | Attending: Emergency Medicine | Admitting: Emergency Medicine

## 2015-05-23 DIAGNOSIS — J45909 Unspecified asthma, uncomplicated: Secondary | ICD-10-CM | POA: Insufficient documentation

## 2015-05-23 DIAGNOSIS — Y9389 Activity, other specified: Secondary | ICD-10-CM | POA: Diagnosis not present

## 2015-05-23 DIAGNOSIS — S4992XA Unspecified injury of left shoulder and upper arm, initial encounter: Secondary | ICD-10-CM | POA: Insufficient documentation

## 2015-05-23 DIAGNOSIS — Z79899 Other long term (current) drug therapy: Secondary | ICD-10-CM | POA: Diagnosis not present

## 2015-05-23 DIAGNOSIS — Z880 Allergy status to penicillin: Secondary | ICD-10-CM | POA: Diagnosis not present

## 2015-05-23 DIAGNOSIS — Y92410 Unspecified street and highway as the place of occurrence of the external cause: Secondary | ICD-10-CM | POA: Insufficient documentation

## 2015-05-23 DIAGNOSIS — M25512 Pain in left shoulder: Secondary | ICD-10-CM

## 2015-05-23 DIAGNOSIS — Y998 Other external cause status: Secondary | ICD-10-CM | POA: Insufficient documentation

## 2015-05-23 MED ORDER — METHOCARBAMOL 500 MG PO TABS: 500.0000 mg | ORAL_TABLET | Freq: Two times a day (BID) | ORAL | Status: DC

## 2015-05-23 MED ORDER — NAPROXEN 500 MG PO TABS: 500.0000 mg | ORAL_TABLET | Freq: Two times a day (BID) | ORAL | Status: DC

## 2015-05-23 MED ORDER — ACETAMINOPHEN 325 MG PO TABS
650.0000 mg | ORAL_TABLET | Freq: Once | ORAL | Status: DC
  Filled 2015-05-23: qty 2

## 2015-05-23 NOTE — ED Notes (Signed)
Patient in pediatrics until after child is seen.
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
## 2015-05-23 NOTE — ED Notes (Signed)
In with PA to see pt.
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
## 2017-08-28 ENCOUNTER — Other Ambulatory Visit (HOSPITAL_COMMUNITY): Payer: Self-pay | Admitting: Chiropractic Medicine

## 2017-08-28 ENCOUNTER — Ambulatory Visit (HOSPITAL_COMMUNITY)
Admission: RE | Admit: 2017-08-28 | Discharge: 2017-08-28 | Disposition: A | Discharge status: Home / Self Care | Payer: No Typology Code available for payment source | Source: Ambulatory Visit | Attending: Chiropractic Medicine | Admitting: Chiropractic Medicine

## 2017-08-28 DIAGNOSIS — M1288 Other specific arthropathies, not elsewhere classified, other specified site: Secondary | ICD-10-CM | POA: Insufficient documentation

## 2017-08-28 DIAGNOSIS — M549 Dorsalgia, unspecified: Secondary | ICD-10-CM

## 2017-08-28 DIAGNOSIS — M542 Cervicalgia: Secondary | ICD-10-CM

## 2017-08-28 DIAGNOSIS — M545 Low back pain: Secondary | ICD-10-CM | POA: Diagnosis not present

## 2017-08-28 DIAGNOSIS — M546 Pain in thoracic spine: Secondary | ICD-10-CM | POA: Insufficient documentation

## 2017-08-28 DIAGNOSIS — M25511 Pain in right shoulder: Secondary | ICD-10-CM | POA: Diagnosis present

## 2017-08-28 DIAGNOSIS — G8929 Other chronic pain: Secondary | ICD-10-CM

## 2018-07-21 ENCOUNTER — Ambulatory Visit (INDEPENDENT_AMBULATORY_CARE_PROVIDER_SITE_OTHER): Payer: BC Managed Care – PPO

## 2018-07-21 ENCOUNTER — Ambulatory Visit: Payer: BC Managed Care – PPO | Admitting: Podiatry

## 2018-07-21 ENCOUNTER — Encounter: Payer: Self-pay | Admitting: Podiatry

## 2018-07-21 VITALS — BP 125/81 | HR 78 | Resp 16

## 2018-07-21 DIAGNOSIS — S92315A Nondisplaced fracture of first metatarsal bone, left foot, initial encounter for closed fracture: Secondary | ICD-10-CM

## 2018-07-21 DIAGNOSIS — S90112A Contusion of left great toe without damage to nail, initial encounter: Secondary | ICD-10-CM | POA: Diagnosis not present

## 2018-07-21 NOTE — Progress Notes (Signed)
  Subjective:  Patient ID: Ashley Booth, female    DOB: 01-12-1981,  MRN: [id number] HPI Chief Complaint  Patient presents with  . Foot Injury    1st toe/MPJ left - chair fell onto foot last night, can't bend toe  . New Patient (Initial Visit)    38 y.o. female presents with the above complaint.   ROS: Denies fever chills nausea vomiting muscle aches pains calf pain back pain chest pain shortness of breath.  Past Medical History:  Diagnosis Date  . Asthma    outgrew it   Past Surgical History:  Procedure Laterality Date  . WISDOM TOOTH EXTRACTION     No current outpatient medications on file.  Allergies  Allergen Reactions  . Penicillins Hives  . Sulfa Antibiotics Hives   Review of Systems Objective:   Vitals:   07/21/18 1101  BP: 125/81  Pulse: 78  Resp: 16    General: Well developed, nourished, in no acute distress, alert and oriented x3   Dermatological: Skin is warm, dry and supple bilateral. Nails x 10 are well maintained; remaining integument appears unremarkable at this time. There are no open sores, no preulcerative lesions, no rash or signs of infection present.  Vascular: Dorsalis Pedis artery and Posterior Tibial artery pedal pulses are 2/4 bilateral with immedate capillary fill time. Pedal hair growth present. No varicosities and no lower extremity edema present bilateral.   Neruologic: Grossly intact via light touch bilateral. Vibratory intact via tuning fork bilateral. Protective threshold with Semmes Wienstein monofilament intact to all pedal sites bilateral. Patellar and Achilles deep tendon reflexes 2+ bilateral. No Babinski or clonus noted bilateral.   Musculoskeletal: No gross boney pedal deformities bilateral. No pain, crepitus, or limitation noted with foot and ankle range of motion bilateral. Muscular strength 5/5 in all groups tested bilateral.  Pain and swelling on palpation of the distal aspect of the proximal phalanx hallux left.  She has  pain on range of motion of the toe itself.  But the extensor and flexor tendons appear to be intact.  Gait: Unassisted, Nonantalgic.    Radiographs:  Radiographs taken today demonstrate a comminuted nondisplaced fracture of the distal aspect of the proximal phalanx hallux left.  Assessment & Plan:   Assessment: Fracture hallux left proximal phalanx distal portion.  Plan: Placed her in a Darco shoe emphasized the importance of her staying in the shoe for the next 4 to 6 weeks.     Taunja Brickner T. [place], [place]

## 2018-08-18 ENCOUNTER — Ambulatory Visit: Payer: BC Managed Care – PPO | Admitting: Podiatry

## 2018-08-18 ENCOUNTER — Ambulatory Visit (INDEPENDENT_AMBULATORY_CARE_PROVIDER_SITE_OTHER): Payer: BC Managed Care – PPO

## 2018-08-18 ENCOUNTER — Encounter: Payer: Self-pay | Admitting: Podiatry

## 2018-08-18 DIAGNOSIS — S92315D Nondisplaced fracture of first metatarsal bone, left foot, subsequent encounter for fracture with routine healing: Secondary | ICD-10-CM

## 2018-08-18 NOTE — Progress Notes (Signed)
She presents today for follow-up of her fracture of her distal phalanx hallux left.  States that is doing much better.  She is very happy with the outcome.  Continues to wear the Darco shoe.  Objective: Vital signs are stable she is alert and oriented x3.  There is no erythema edema cellulitis drainage or odor.  Edema and ecchymosis has gone down considerably.  There is no tenderness on palpation of the area.  Radiographs taken today demonstrates a well-healing fracture nondisplaced non-comminuted distal aspect of the proximal phalanx hallux left.  Only visible on lateral view.  Assessment: Well-healing fracture proximal phalanx hallux left.  Plan: Discussed etiology pathology conservative therapies this point time went ahead and dismiss the Darco shoe follow-up with her on as-needed basis.

## 2019-08-23 ENCOUNTER — Ambulatory Visit (INDEPENDENT_AMBULATORY_CARE_PROVIDER_SITE_OTHER): Payer: BC Managed Care – PPO | Admitting: Otolaryngology

## 2019-08-23 ENCOUNTER — Other Ambulatory Visit: Payer: Self-pay

## 2019-08-23 VITALS — Temp 97.5°F

## 2019-08-23 DIAGNOSIS — H6121 Impacted cerumen, right ear: Secondary | ICD-10-CM

## 2019-08-23 DIAGNOSIS — H60311 Diffuse otitis externa, right ear: Secondary | ICD-10-CM | POA: Diagnosis not present

## 2019-08-23 NOTE — Progress Notes (Signed)
HPI: Ashley Booth is a 39 y.o. female who presents is referred by Ashley Core, PA for evaluation of right ear discomfort she has had for about a week now.  Irrigation of the ear canal was unsuccessful.  She complains of blocked hearing as well as a pain in the right ear she has had for the past week.  This is worse when she chews..  Past Medical History:  Diagnosis Date  . Asthma    outgrew it   Past Surgical History:  Procedure Laterality Date  . WISDOM TOOTH EXTRACTION     Social History   Socioeconomic History  . Marital status: Single    Spouse name: Not on file  . Number of children: Not on file  . Years of education: Not on file  . Highest education level: Not on file  Occupational History  . Not on file  Tobacco Use  . Smoking status: Never Smoker  . Smokeless tobacco: Never Used  Substance and Sexual Activity  . Alcohol use: Yes  . Drug use: No  . Sexual activity: Yes    Birth control/protection: None  Other Topics Concern  . Not on file  Social History Narrative  . Not on file   Social Determinants of Health   Financial Resource Strain:   . Difficulty of Paying Living Expenses:   Food Insecurity:   . Worried About Charity fundraiser in the Last Year:   . Arboriculturist in the Last Year:   Transportation Needs:   . Film/video editor (Medical):   Marland Kitchen Lack of Transportation (Non-Medical):   Physical Activity:   . Days of Exercise per Week:   . Minutes of Exercise per Session:   Stress:   . Feeling of Stress :   Social Connections:   . Frequency of Communication with Friends and Family:   . Frequency of Social Gatherings with Friends and Family:   . Attends Religious Services:   . Active Member of Clubs or Organizations:   . Attends Archivist Meetings:   Marland Kitchen Marital Status:    No family history on file. Allergies  Allergen Reactions  . Penicillins Hives  . Sulfa Antibiotics Hives   Prior to Admission medications   Not on File      Positive ROS: Otherwise negative  All other systems have been reviewed and were otherwise negative with the exception of those mentioned in the HPI and as above.  Physical Exam: Constitutional: Alert, well-appearing, no acute distress Ears: External ears without lesions or tenderness.  Left ear canal and TM are clear.  Right ear canal is completely occluded with cerumen.  This was removed with a curette forceps and suction.  She had slight inflammatory changes of the ear canal after cleaning the ear canal.  The TM was intact.  I applied CSF powder to the right ear. Nasal: External nose without lesions. Septum midline.. Clear nasal passages Oral: Lips and gums without lesions. Tongue and palate mucosa without lesions. Posterior oropharynx clear. Neck: No palpable adenopathy or masses Respiratory: Breathing comfortably  Skin: No facial/neck lesions or rash noted.  Cerumen impaction removal  Date/Time: 08/23/2019 2:46 PM Performed by: Ashley Nunnery, MD Authorized by: Ashley Nunnery, MD   Consent:    Consent obtained:  Verbal   Consent given by:  Patient   Risks discussed:  Pain and bleeding Procedure details:    Location:  L ear   Procedure type: curette, suction and forceps  Post-procedure details:    Inspection:  TM intact and canal normal   Hearing quality:  Improved   Patient tolerance of procedure:  Tolerated well, no immediate complications Comments:     She had mild inflammatory changes of the right ear canal.  I applied CSF powder to the right ear canal.  The TM was intact.    Assessment: Right ear cerumen impaction with mild external otitis.  Plan: Her ear felt much better after cleaning the ear canal. I prescribed Cortisporin otic suspension drops to use if she develops any further pain in the right ear. She will follow-up as needed.   Ashley Bonds, MD   CC:
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
